# Patient Record
Sex: Female | Born: 1955 | Race: White | Hispanic: No | Marital: Married | Smoking: Never smoker
Health system: Southern US, Community
[De-identification: ages and names within clinical notes are randomized; demographics above are authoritative.]

## PROBLEM LIST (undated history)

## (undated) DIAGNOSIS — K649 Unspecified hemorrhoids: Secondary | ICD-10-CM

## (undated) DIAGNOSIS — M1990 Unspecified osteoarthritis, unspecified site: Secondary | ICD-10-CM

## (undated) DIAGNOSIS — E079 Disorder of thyroid, unspecified: Secondary | ICD-10-CM

## (undated) DIAGNOSIS — N879 Dysplasia of cervix uteri, unspecified: Secondary | ICD-10-CM

## (undated) DIAGNOSIS — I10 Essential (primary) hypertension: Secondary | ICD-10-CM

## (undated) DIAGNOSIS — N393 Stress incontinence (female) (male): Secondary | ICD-10-CM

## (undated) DIAGNOSIS — M722 Plantar fascial fibromatosis: Secondary | ICD-10-CM

## (undated) DIAGNOSIS — K635 Polyp of colon: Secondary | ICD-10-CM

## (undated) HISTORY — DX: Dysplasia of cervix uteri, unspecified: N87.9

## (undated) HISTORY — PX: COLONOSCOPY: SHX174

## (undated) HISTORY — DX: Plantar fascial fibromatosis: M72.2

## (undated) HISTORY — DX: Unspecified osteoarthritis, unspecified site: M19.90

## (undated) HISTORY — DX: Disorder of thyroid, unspecified: E07.9

## (undated) HISTORY — DX: Unspecified hemorrhoids: K64.9

## (undated) HISTORY — DX: Essential (primary) hypertension: I10

## (undated) HISTORY — DX: Stress incontinence (female) (male): N39.3

## (undated) HISTORY — DX: Polyp of colon: K63.5

---
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
## 2001-01-14 HISTORY — PX: HEMORRHOID SURGERY: SHX153

## 2004-08-28 ENCOUNTER — Ambulatory Visit: Payer: Self-pay

## 2005-01-14 HISTORY — PX: TOTAL HIP ARTHROPLASTY: SHX124

## 2005-09-26 ENCOUNTER — Ambulatory Visit: Payer: Self-pay

## 2005-10-03 ENCOUNTER — Ambulatory Visit: Payer: Self-pay

## 2005-12-02 ENCOUNTER — Ambulatory Visit: Payer: Self-pay | Admitting: Gastroenterology

## 2006-03-07 ENCOUNTER — Ambulatory Visit: Payer: Self-pay

## 2006-10-09 ENCOUNTER — Ambulatory Visit: Payer: Self-pay

## 2007-10-12 ENCOUNTER — Ambulatory Visit: Payer: Self-pay

## 2008-03-14 HISTORY — PX: ENDOMETRIAL BIOPSY: SHX622

## 2008-10-17 ENCOUNTER — Ambulatory Visit: Payer: Self-pay

## 2008-11-28 ENCOUNTER — Ambulatory Visit: Payer: Self-pay | Admitting: Gastroenterology

## 2009-11-01 ENCOUNTER — Ambulatory Visit: Payer: Self-pay

## 2010-11-08 ENCOUNTER — Ambulatory Visit: Payer: Self-pay

## 2013-10-11 LAB — HM PAP SMEAR: HM Pap smear: NEGATIVE

## 2015-10-19 ENCOUNTER — Other Ambulatory Visit: Payer: Self-pay | Admitting: Obstetrics and Gynecology

## 2015-10-19 DIAGNOSIS — Z1231 Encounter for screening mammogram for malignant neoplasm of breast: Secondary | ICD-10-CM

## 2015-11-20 ENCOUNTER — Encounter: Payer: Self-pay | Admitting: Radiology

## 2015-11-20 ENCOUNTER — Ambulatory Visit
Admission: RE | Admit: 2015-11-20 | Discharge: 2015-11-20 | Disposition: A | Discharge status: Home / Self Care | Payer: BC Managed Care – PPO | Source: Ambulatory Visit | Attending: Obstetrics and Gynecology | Admitting: Obstetrics and Gynecology

## 2015-11-20 DIAGNOSIS — Z1231 Encounter for screening mammogram for malignant neoplasm of breast: Secondary | ICD-10-CM | POA: Diagnosis present

## 2015-11-24 ENCOUNTER — Other Ambulatory Visit: Payer: Self-pay | Admitting: *Deleted

## 2015-11-24 ENCOUNTER — Inpatient Hospital Stay
Admission: RE | Admit: 2015-11-24 | Discharge: 2015-11-24 | Disposition: A | Discharge status: Home / Self Care | Payer: Self-pay | Source: Ambulatory Visit | Attending: *Deleted | Admitting: *Deleted

## 2015-11-24 DIAGNOSIS — Z9289 Personal history of other medical treatment: Secondary | ICD-10-CM

## 2015-11-27 LAB — HM MAMMOGRAPHY: HM Mammogram: NORMAL (ref 0–4)

## 2016-10-21 ENCOUNTER — Encounter: Payer: Self-pay | Admitting: Obstetrics and Gynecology

## 2016-10-21 ENCOUNTER — Ambulatory Visit (INDEPENDENT_AMBULATORY_CARE_PROVIDER_SITE_OTHER): Payer: BC Managed Care – PPO | Admitting: Obstetrics and Gynecology

## 2016-10-21 VITALS — BP 130/80 | HR 76 | Ht 63.5 in | Wt 180.0 lb

## 2016-10-21 DIAGNOSIS — Z124 Encounter for screening for malignant neoplasm of cervix: Secondary | ICD-10-CM

## 2016-10-21 DIAGNOSIS — Z1231 Encounter for screening mammogram for malignant neoplasm of breast: Secondary | ICD-10-CM

## 2016-10-21 DIAGNOSIS — Z01419 Encounter for gynecological examination (general) (routine) without abnormal findings: Secondary | ICD-10-CM | POA: Diagnosis not present

## 2016-10-21 DIAGNOSIS — Z1151 Encounter for screening for human papillomavirus (HPV): Secondary | ICD-10-CM | POA: Diagnosis not present

## 2016-10-21 DIAGNOSIS — Z1239 Encounter for other screening for malignant neoplasm of breast: Secondary | ICD-10-CM

## 2016-10-21 NOTE — Progress Notes (Signed)
PCP: Patient, No Pcp Per   Chief Complaint  Patient presents with  . Gynecologic Exam    knot on groin area    HPI:      [name] is a 61 y.o. No obstetric history on file. who LMP was No LMP recorded. Patient is postmenopausal., presents today for her annual examination.  Her menses are absent due to menopause. She does not have intermenstrual bleeding. She does not have vasomotor sx.  Sex activity: not sexually active. She does not have vaginal dryness.  Last Pap: October 11, 2013  Results were: no abnormalities /neg HPV DNA.  Hx of STDs: none  Last mammogram: November 20, 2015  Results were: normal--routine follow-up in 12 months There is no FH of breast cancer. There is no FH of ovarian cancer. The patient does do self-breast exams.  Colonoscopy: colonoscopy 3 years ago without abnormalities. . Repeat due after 5 years.  DEXA: 2011; WNL  Tobacco use: The patient denies current or previous tobacco use. Alcohol use: social drinker Exercise: moderately active  She does get adequate calcium and Vitamin D in her diet.  Labs with PCP.   Past Medical History:  Diagnosis Date  . Cervical dysplasia 1970  . Colon polyps   . Hemorrhoids   . Hypertension   . Osteoarthritis   . Plantar fasciitis   . Stress incontinence     Past Surgical History:  Procedure Laterality Date  . COLONOSCOPY  2010, 2015   Polyps  . CRYOTHERAPY  1970  . ENDOMETRIAL BIOPSY  03/2008  . HEMORRHOID SURGERY  2003  . TOTAL HIP ARTHROPLASTY  2007   Left, 2018 Right    Family History  Problem Relation Age of Onset  . Hypertension Mother   . Diabetes type II Mother   . Colon polyps Mother   . Parkinson's disease Father 30  . Parkinson's disease Paternal Aunt 60    Social History   Social History  . Marital status: Married    Spouse name: N/A  . Number of children: N/A  . Years of education: N/A   Occupational History  . Not on file.   Social History Main Topics  .  Smoking status: Never Smoker  . Smokeless tobacco: Never Used  . Alcohol use Yes     Comment: occ  . Drug use: No  . Sexual activity: Not Currently    Partners: Male    Birth control/ protection: None, Post-menopausal   Other Topics Concern  . Not on file   Social History Narrative  . No narrative on file    Current Meds  Medication Sig  . aspirin EC 81 MG tablet Take 1 tab by mouth once daily  . atorvastatin (LIPITOR) 10 MG tablet Take by mouth.  . cetirizine (ZYRTEC) 10 MG tablet Take by mouth.  . Cholecalciferol (VITAMIN D) 2000 units tablet Take by mouth.  . hydrochlorothiazide (HYDRODIURIL) 25 MG tablet Take 1 tab by mouth once daily  . metoprolol succinate (TOPROL-XL) 50 MG 24 hr tablet Take by mouth.  . PSYLLIUM PO 1 tablespoon in water every day.  . tretinoin (RETIN-A) 0.025 % cream       ROS:  Review of Systems  Constitutional: Negative for fatigue, fever and unexpected weight change.  Respiratory: Negative for cough, shortness of breath and wheezing.   Cardiovascular: Negative for chest pain, palpitations and leg swelling.  Gastrointestinal: Negative for blood in stool, constipation, diarrhea, nausea and vomiting.  Endocrine: Negative for  cold intolerance, heat intolerance and polyuria.  Genitourinary: Negative for dyspareunia, dysuria, flank pain, frequency, genital sores, hematuria, menstrual problem, pelvic pain, urgency, vaginal bleeding, vaginal discharge and vaginal pain.  Musculoskeletal: Negative for back pain, joint swelling and myalgias.  Skin: Negative for rash.  Neurological: Negative for dizziness, syncope, light-headedness, numbness and headaches.  Hematological: Negative for adenopathy.  Psychiatric/Behavioral: Negative for agitation, confusion, sleep disturbance and suicidal ideas. The patient is not nervous/anxious.      Objective: BP 130/80 (BP Location: Left Arm, Patient Position: Sitting, Cuff Size: Large)   Pulse 76   Ht 5' 3.5" (1.613  m)   Wt 180 lb (81.6 kg)   BMI 31.39 kg/m    Physical Exam  Constitutional: She is oriented to person, place, and time. She appears well-developed and well-nourished.  Genitourinary: Vagina normal and uterus normal. There is no rash or tenderness on the right labia. There is no rash or tenderness on the left labia. No erythema or tenderness in the vagina. No vaginal discharge found. Right adnexum does not display mass and does not display tenderness. Left adnexum does not display mass and does not display tenderness. Cervix does not exhibit motion tenderness or polyp. Uterus is not enlarged or tender.  Neck: Normal range of motion. No thyromegaly present.  Cardiovascular: Normal rate, regular rhythm and normal heart sounds.   No murmur heard. Pulmonary/Chest: Effort normal and breath sounds normal. Right breast exhibits no mass, no nipple discharge, no skin change and no tenderness. Left breast exhibits no mass, no nipple discharge, no skin change and no tenderness.  Abdominal: Soft. There is no tenderness. There is no guarding.  Musculoskeletal: Normal range of motion.  Neurological: She is alert and oriented to person, place, and time. No cranial nerve deficit.  Psychiatric: She has a normal mood and affect. Her behavior is normal.  Vitals reviewed.   Assessment/Plan:  Encounter for annual routine gynecological examination  Cervical cancer screening - Plan: IGP, Aptima HPV  Screening for HPV (human papillomavirus) - Plan: IGP, Aptima HPV  Screening for breast cancer - Pt to sched mammo - Plan: MM DIGITAL SCREENING BILATERAL          GYN counsel mammography screening, menopause, adequate intake of calcium and vitamin D, diet and exercise    F/U  Return in about 1 year (around 10/21/2017).  Alicia B. Copland, PA-C 10/21/2016 2:09 PM

## 2016-10-23 LAB — IGP, APTIMA HPV
HPV APTIMA: NEGATIVE
PAP SMEAR COMMENT: 0

## 2016-11-27 ENCOUNTER — Encounter: Payer: Self-pay | Admitting: Obstetrics and Gynecology

## 2016-11-27 ENCOUNTER — Ambulatory Visit
Admission: RE | Admit: 2016-11-27 | Discharge: 2016-11-27 | Disposition: A | Discharge status: Home / Self Care | Payer: BC Managed Care – PPO | Source: Ambulatory Visit | Attending: Obstetrics and Gynecology | Admitting: Obstetrics and Gynecology

## 2016-11-27 DIAGNOSIS — Z1231 Encounter for screening mammogram for malignant neoplasm of breast: Secondary | ICD-10-CM | POA: Insufficient documentation

## 2016-11-27 DIAGNOSIS — Z1239 Encounter for other screening for malignant neoplasm of breast: Secondary | ICD-10-CM

## 2017-10-23 ENCOUNTER — Encounter: Payer: Self-pay | Admitting: Obstetrics and Gynecology

## 2017-10-23 ENCOUNTER — Encounter: Payer: BC Managed Care – PPO | Admitting: Obstetrics and Gynecology

## 2017-10-23 VITALS — BP 120/72 | HR 66 | Ht 64.0 in | Wt 173.0 lb

## 2017-10-23 DIAGNOSIS — Z01419 Encounter for gynecological examination (general) (routine) without abnormal findings: Secondary | ICD-10-CM

## 2017-10-23 DIAGNOSIS — Z1239 Encounter for other screening for malignant neoplasm of breast: Secondary | ICD-10-CM

## 2017-10-23 NOTE — Progress Notes (Signed)
PCP: Patient, No Pcp Per   Chief Complaint  Patient presents with  . Gynecologic Exam  . Error    HPI:      [name] is a 62 y.o. No obstetric history on file. who LMP was No LMP recorded. Patient is postmenopausal., presents today for her annual examination.  Her menses are absent due to menopause. She does not have intermenstrual bleeding. She does not have vasomotor sx.  Sex activity: not sexually active. She does not have vaginal dryness.  Last Pap: 10/21/16  Results were: no abnormalities /neg HPV DNA.  Hx of STDs: none  Last mammogram: 11/27/16 Results were: normal--routine follow-up in 12 months There is no FH of breast cancer. There is no FH of ovarian cancer. The patient does do self-breast exams.  Colonoscopy: colonoscopy 4 years ago without abnormalities. Repeat due after 5 years.  DEXA: 2011; WNL  Tobacco use: The patient denies current or previous tobacco use. Alcohol use: social drinker Exercise: moderately active  She does get adequate calcium and Vitamin D in her diet.  Labs with PCP.   Past Medical History:  Diagnosis Date  . Cervical dysplasia 1970  . Colon polyps   . Hemorrhoids   . Hypertension   . Osteoarthritis   . Plantar fasciitis   . Stress incontinence     Past Surgical History:  Procedure Laterality Date  . COLONOSCOPY  2010, 2015   Polyps  . CRYOTHERAPY  1970  . ENDOMETRIAL BIOPSY  03/2008  . HEMORRHOID SURGERY  2003  . TOTAL HIP ARTHROPLASTY  2007   Left, 2018 Right    Family History  Problem Relation Age of Onset  . Hypertension Mother   . Diabetes type II Mother   . Colon polyps Mother   . Parkinson's disease Father 35  . Parkinson's disease Paternal Aunt 71  . Breast cancer Neg Hx     Social History   Socioeconomic History  . Marital status: Married    Spouse name: Not on file  . Number of children: Not on file  . Years of education: Not on file  . Highest education level: Not on file    Occupational History  . Not on file  Social Needs  . Financial resource strain: Not on file  . Food insecurity:    Worry: Not on file    Inability: Not on file  . Transportation needs:    Medical: Not on file    Non-medical: Not on file  Tobacco Use  . Smoking status: Never Smoker  . Smokeless tobacco: Never Used  Substance and Sexual Activity  . Alcohol use: Yes    Comment: occ  . Drug use: No  . Sexual activity: Not Currently    Partners: Male    Birth control/protection: Post-menopausal  Lifestyle  . Physical activity:    Days per week: Not on file    Minutes per session: Not on file  . Stress: Not on file  Relationships  . Social connections:    Talks on phone: Not on file    Gets together: Not on file    Attends religious service: Not on file    Active member of club or organization: Not on file    Attends meetings of clubs or organizations: Not on file    Relationship status: Not on file  . Intimate partner violence:    Fear of current or ex partner: Not on file    Emotionally abused: Not  on file    Physically abused: Not on file    Forced sexual activity: Not on file  Other Topics Concern  . Not on file  Social History Narrative  . Not on file    Current Meds  Medication Sig  . aspirin EC 81 MG tablet Take 1 tab by mouth once daily  . atorvastatin (LIPITOR) 10 MG tablet atorvastatin 10 mg tablet  TAKE 1 TABLET (10 MG TOTAL) BY MOUTH ONCE DAILY.  . cetirizine (ZYRTEC) 10 MG tablet Take by mouth.  . Cholecalciferol (VITAMIN D) 2000 units tablet Take by mouth.  . fluticasone (FLONASE) 50 MCG/ACT nasal spray Place into both nostrils 2 (two) times daily.  . hydrochlorothiazide (HYDRODIURIL) 25 MG tablet Take 1 tab by mouth once daily  . Influenza Vac Subunit Quad (FLUCELVAX QUADRIVALENT) 0.5 ML SUSY Flucelvax Quad 2018-2019 (PF) 60 mcg (15 mcg x 4)/0.5 mL IM syringe  TO BE ADMINISTERED BY PHARMACIST FOR IMMUNIZATION  . Olmesartan-amLODIPine-HCTZ 20-5-12.5 MG  TABS   . PSYLLIUM PO 1 tablespoon in water every day.  . tretinoin (RETIN-A) 0.025 % cream     ROS:  Review of Systems  Constitutional: Negative for fatigue, fever and unexpected weight change.  Respiratory: Negative for cough, shortness of breath and wheezing.   Cardiovascular: Negative for chest pain, palpitations and leg swelling.  Gastrointestinal: Negative for blood in stool, constipation, diarrhea, nausea and vomiting.  Endocrine: Negative for cold intolerance, heat intolerance and polyuria.  Genitourinary: Negative for dyspareunia, dysuria, flank pain, frequency, genital sores, hematuria, menstrual problem, pelvic pain, urgency, vaginal bleeding, vaginal discharge and vaginal pain.  Musculoskeletal: Negative for back pain, joint swelling and myalgias.  Skin: Negative for rash.  Neurological: Negative for dizziness, syncope, light-headedness, numbness and headaches.  Hematological: Negative for adenopathy.  Psychiatric/Behavioral: Negative for agitation, confusion, sleep disturbance and suicidal ideas. The patient is not nervous/anxious.     Objective: BP 120/72   Pulse 66   Ht 5\' 4"  (1.626 m)   Wt 173 lb (78.5 kg)   BMI 29.70 kg/m    Physical Exam  Constitutional: She is oriented to person, place, and time. She appears well-developed and well-nourished.  Genitourinary: Vagina normal and uterus normal. There is no rash or tenderness on the right labia. There is no rash or tenderness on the left labia. No erythema or tenderness in the vagina. No vaginal discharge found. Right adnexum does not display mass and does not display tenderness. Left adnexum does not display mass and does not display tenderness. Cervix does not exhibit motion tenderness or polyp. Uterus is not enlarged or tender.  Neck: Normal range of motion. No thyromegaly present.  Cardiovascular: Normal rate, regular rhythm and normal heart sounds.  No murmur heard. Pulmonary/Chest: Effort normal and breath  sounds normal. Right breast exhibits no mass, no nipple discharge, no skin change and no tenderness. Left breast exhibits no mass, no nipple discharge, no skin change and no tenderness.  Abdominal: Soft. There is no tenderness. There is no guarding.  Musculoskeletal: Normal range of motion.  Neurological: She is alert and oriented to person, place, and time. No cranial nerve deficit.  Psychiatric: She has a normal mood and affect. Her behavior is normal.  Vitals reviewed.   Assessment/Plan:  Encounter for annual routine gynecological examination  Screening for breast cancer - Plan: MM 3D SCREEN BREAST BILATERAL  Erroneous encounter - disregard          GYN counsel mammography screening, menopause, adequate intake of calcium and vitamin  D, diet and exercise    F/U  Return in about 1 year (around 10/24/2018).  Khari Mally B. Jostin Rue, PA-C 12/05/2017 3:08 PM

## 2017-10-23 NOTE — Patient Instructions (Signed)
I value your feedback and entrusting us with your care. If you get a [facility] patient survey, I would appreciate you taking the time to let us know about your experience today. Thank you! 

## 2017-10-29 NOTE — Progress Notes (Signed)
This encounter was created in error - please disregard.

## 2017-11-28 ENCOUNTER — Ambulatory Visit
Admission: RE | Admit: 2017-11-28 | Discharge: 2017-11-28 | Disposition: A | Discharge status: Home / Self Care | Payer: BC Managed Care – PPO | Source: Ambulatory Visit | Attending: Obstetrics and Gynecology | Admitting: Obstetrics and Gynecology

## 2017-11-28 DIAGNOSIS — Z1239 Encounter for other screening for malignant neoplasm of breast: Secondary | ICD-10-CM | POA: Diagnosis not present

## 2017-11-30 ENCOUNTER — Encounter: Payer: Self-pay | Admitting: Obstetrics and Gynecology

## 2018-10-15 ENCOUNTER — Other Ambulatory Visit: Payer: Self-pay | Admitting: Family Medicine

## 2018-10-15 DIAGNOSIS — Z1231 Encounter for screening mammogram for malignant neoplasm of breast: Secondary | ICD-10-CM

## 2018-10-22 NOTE — Progress Notes (Signed)
PCP: Patient, No Pcp Per   Chief Complaint  Patient presents with  . Gynecologic Exam    HPI:      [name] is a 63 y.o. No obstetric history on file. who LMP was No LMP recorded. Patient is postmenopausal., presents today for her annual examination.  Her menses are absent due to menopause. She does not have intermenstrual bleeding. She does not have vasomotor sx.  Sex activity: not sexually active. She does not have vaginal dryness.  Last Pap: 10/21/16  Results were: no abnormalities /neg HPV DNA.  Hx of STDs: none  Last mammogram: 11/18/17 Results were: normal--routine follow-up in 12 months There is no FH of breast cancer. There is no FH of ovarian cancer. The patient does do self-breast exams.  Colonoscopy: colonoscopy 9/20 without abnormalities.  Repeat due after 10 years. Having issues with hemorrhoids since procedure with rectal bleeding with BMs. Having increased constipation. Hasn't tried OTC meds.   DEXA: 2011; WNL; due for repeat  Tobacco use: The patient denies current or previous tobacco use. Alcohol use: social drinker  No drug use. Exercise: moderately active  She does get adequate calcium and Vitamin D in her diet.  Labs with PCP.   Past Medical History:  Diagnosis Date  . Cervical dysplasia 1970  . Colon polyps   . Hemorrhoids   . Hypertension   . Osteoarthritis   . Plantar fasciitis   . Stress incontinence   . Thyroid disease    Hypothyroidism    Past Surgical History:  Procedure Laterality Date  . COLONOSCOPY  2010, 2015, 2020   Polyps; 9/20 repeat in 10 yrs  . CRYOTHERAPY  1970  . ENDOMETRIAL BIOPSY  03/2008  . HEMORRHOID SURGERY  2003  . TOTAL HIP ARTHROPLASTY  2007   Left, 2018 Right    Family History  Problem Relation Age of Onset  . Hypertension Mother   . Diabetes type II Mother   . Colon polyps Mother   . CVA Mother 100  . Parkinson's disease Father 34  . Parkinson's disease Paternal Aunt 32  . Breast cancer Neg Hx      Social History   Socioeconomic History  . Marital status: Married    Spouse name: Not on file  . Number of children: Not on file  . Years of education: Not on file  . Highest education level: Not on file  Occupational History  . Not on file  Social Needs  . Financial resource strain: Not on file  . Food insecurity    Worry: Not on file    Inability: Not on file  . Transportation needs    Medical: Not on file    Non-medical: Not on file  Tobacco Use  . Smoking status: Never Smoker  . Smokeless tobacco: Never Used  Substance and Sexual Activity  . Alcohol use: Yes    Comment: occ  . Drug use: No  . Sexual activity: Not Currently    Partners: Male    Birth control/protection: Post-menopausal  Lifestyle  . Physical activity    Days per week: Not on file    Minutes per session: Not on file  . Stress: Not on file  Relationships  . Social Musician on phone: Not on file    Gets together: Not on file    Attends religious service: Not on file    Active member of club or organization: Not on file    Attends meetings  of clubs or organizations: Not on file    Relationship status: Not on file  . Intimate partner violence    Fear of current or ex partner: Not on file    Emotionally abused: Not on file    Physically abused: Not on file    Forced sexual activity: Not on file  Other Topics Concern  . Not on file  Social History Narrative  . Not on file    Current Meds  Medication Sig  . aspirin EC 81 MG tablet Take 1 tab by mouth once daily  . atorvastatin (LIPITOR) 10 MG tablet atorvastatin 10 mg tablet  TAKE 1 TABLET (10 MG TOTAL) BY MOUTH ONCE DAILY.  . cetirizine (ZYRTEC) 10 MG tablet Take by mouth.  . Cholecalciferol (VITAMIN D) 2000 units tablet Take by mouth.  . fluticasone (FLONASE) 50 MCG/ACT nasal spray Place into both nostrils 2 (two) times daily.  . hydrochlorothiazide (HYDRODIURIL) 25 MG tablet Take 1 tab by mouth once daily  . KRILL OIL PO  Take by mouth.  . levothyroxine (SYNTHROID) 50 MCG tablet TAKE 1 TABLET DAILY ON EMPTY STOMACH WITH A GLASS OF WATER AT LEAST 30-60 MINUTES BEFORE BREAKFAST.  Marland Kitchen olmesartan (BENICAR) 20 MG tablet Take by mouth.  . PSYLLIUM PO 1 tablespoon in water every day.  . tretinoin (RETIN-A) 0.025 % cream       ROS:  Review of Systems  Constitutional: Negative for fatigue, fever and unexpected weight change.  Respiratory: Negative for cough, shortness of breath and wheezing.   Cardiovascular: Negative for chest pain, palpitations and leg swelling.  Gastrointestinal: Negative for blood in stool, constipation, diarrhea, nausea and vomiting.  Endocrine: Negative for cold intolerance, heat intolerance and polyuria.  Genitourinary: Negative for dyspareunia, dysuria, flank pain, frequency, genital sores, hematuria, menstrual problem, pelvic pain, urgency, vaginal bleeding, vaginal discharge and vaginal pain.  Musculoskeletal: Negative for back pain, joint swelling and myalgias.  Skin: Negative for rash.  Neurological: Negative for dizziness, syncope, light-headedness, numbness and headaches.  Hematological: Negative for adenopathy.  Psychiatric/Behavioral: Negative for agitation, confusion, sleep disturbance and suicidal ideas. The patient is not nervous/anxious.      Objective: BP 108/80   Ht 5' 3.5" (1.613 m)   Wt 174 lb (78.9 kg)   BMI 30.34 kg/m    Physical Exam Constitutional:      Appearance: She is well-developed.  Genitourinary:     Vulva, vagina, uterus, right adnexa and left adnexa normal.     No vulval lesion or tenderness noted.     No vaginal discharge, erythema or tenderness.     No cervical motion tenderness or polyp.     Uterus is not enlarged or tender.     No right or left adnexal mass present.     Right adnexa not tender.     Left adnexa not tender.  Neck:     Musculoskeletal: Normal range of motion.     Thyroid: No thyromegaly.  Cardiovascular:     Rate and Rhythm:  Normal rate and regular rhythm.     Heart sounds: Normal heart sounds. No murmur.  Pulmonary:     Effort: Pulmonary effort is normal.     Breath sounds: Normal breath sounds.  Chest:     Breasts:        Right: No mass, nipple discharge, skin change or tenderness.        Left: No mass, nipple discharge, skin change or tenderness.  Abdominal:     Palpations: Abdomen  is soft.     Tenderness: There is no abdominal tenderness. There is no guarding.  Musculoskeletal: Normal range of motion.  Neurological:     General: No focal deficit present.     Mental Status: She is alert and oriented to person, place, and time.     Cranial Nerves: No cranial nerve deficit.  Skin:    General: Skin is warm and dry.  Psychiatric:        Mood and Affect: Mood normal.        Behavior: Behavior normal.        Thought Content: Thought content normal.        Judgment: Judgment normal.  Vitals signs reviewed.     Assessment/Plan:  Encounter for annual routine gynecological examination  Encounter for screening mammogram for malignant neoplasm of breast - Plan: pt has mammo sched  Screening for osteoporosis - Plan: DG Bone Density,   Hemorrhoid--try OTC products/increase fiber for constipation. Can do Rx if sx persist.           GYN counsel mammography screening, menopause, adequate intake of calcium and vitamin D, diet and exercise    F/U  Return in about 2 years (around 10/25/2020) for annual.  Brandi Armato B. Shanterria Franta, PA-C 10/26/2018 11:29 AM

## 2018-10-26 ENCOUNTER — Ambulatory Visit (INDEPENDENT_AMBULATORY_CARE_PROVIDER_SITE_OTHER): Payer: Medicare Other | Admitting: Obstetrics and Gynecology

## 2018-10-26 ENCOUNTER — Other Ambulatory Visit: Payer: Self-pay

## 2018-10-26 ENCOUNTER — Encounter: Payer: Self-pay | Admitting: Obstetrics and Gynecology

## 2018-10-26 VITALS — BP 108/80 | Ht 63.5 in | Wt 174.0 lb

## 2018-10-26 DIAGNOSIS — Z01419 Encounter for gynecological examination (general) (routine) without abnormal findings: Secondary | ICD-10-CM | POA: Diagnosis not present

## 2018-10-26 DIAGNOSIS — Z13820 Encounter for screening for osteoporosis: Secondary | ICD-10-CM

## 2018-10-26 DIAGNOSIS — Z1231 Encounter for screening mammogram for malignant neoplasm of breast: Secondary | ICD-10-CM

## 2018-10-26 DIAGNOSIS — K640 First degree hemorrhoids: Secondary | ICD-10-CM

## 2018-10-26 NOTE — Patient Instructions (Signed)
I value your feedback and entrusting us with your care. If you get a [facility] patient survey, I would appreciate you taking the time to let us know about your experience today. Thank you!  [facility]: [phone]    

## 2018-12-02 ENCOUNTER — Ambulatory Visit
Admission: RE | Admit: 2018-12-02 | Discharge: 2018-12-02 | Disposition: A | Discharge status: Home / Self Care | Payer: Medicare Other | Source: Ambulatory Visit | Attending: Family Medicine | Admitting: Family Medicine

## 2018-12-02 DIAGNOSIS — Z1231 Encounter for screening mammogram for malignant neoplasm of breast: Secondary | ICD-10-CM | POA: Diagnosis not present

## 2019-09-08 ENCOUNTER — Telehealth: Payer: Self-pay | Admitting: Obstetrics and Gynecology

## 2019-09-08 NOTE — Telephone Encounter (Signed)
Patient is calling to schedule yearly follow up for October. patient is on medicare and wants know how she needs to go about scheduling her yearly mammogram. Please advise

## 2019-09-08 NOTE — Telephone Encounter (Signed)
Clarified with [facility] Dec.

## 2019-10-27 NOTE — Progress Notes (Signed)
PCP: Patient, No Pcp Per   Chief Complaint  Patient presents with  . Gynecologic Exam    HPI:      Kirsten Jackson is a 64 y.o. No obstetric history on file. who LMP was No LMP recorded. Patient is postmenopausal., presents today for her annual examination.  Her menses are absent due to menopause. She does not have intermenstrual bleeding. She has had occas vasomotor sx recently but unusual for pt. Could be stress related.  Sex activity: not sexually active. She does not have vaginal dryness.  Last Pap: 10/21/16  Results were: no abnormalities /neg HPV DNA.  Hx of STDs: none  Last mammogram: 12/02/18 Results were: normal--routine follow-up in 12 months There is no FH of breast cancer. There is no FH of ovarian cancer. The patient does self-breast exams.  Colonoscopy: colonoscopy 9/20 without abnormalities.  Repeat due after 10 years. No issues with hemorrhoids recently.  DEXA: 2011; WNL; due for repeat--not done last yr; pt declines.  Tobacco use: The patient denies current or previous tobacco use. Alcohol use: social drinker  No drug use. Exercise: moderately active  She does get adequate calcium and Vitamin D in her diet.  Labs with PCP.   Past Medical History:  Diagnosis Date  . Cervical dysplasia 1970  . Colon polyps   . Hemorrhoids   . Hypertension   . Osteoarthritis   . Plantar fasciitis   . Stress incontinence   . Thyroid disease    Hypothyroidism    Past Surgical History:  Procedure Laterality Date  . COLONOSCOPY  2010, 2015, 2020   Polyps; 9/20 repeat in 10 yrs  . CRYOTHERAPY  1970  . ENDOMETRIAL BIOPSY  03/2008  . HEMORRHOID SURGERY  2003  . TOTAL HIP ARTHROPLASTY  2007   Left, 2018 Right    Family History  Problem Relation Age of Onset  . Hypertension Mother   . Diabetes type II Mother   . Colon polyps Mother   . CVA Mother 75  . Parkinson's disease Father 62  . Parkinson's disease Paternal Aunt 47  . Breast cancer Neg Hx      Social History   Socioeconomic History  . Marital status: Married    Spouse name: Not on file  . Number of children: Not on file  . Years of education: Not on file  . Highest education level: Not on file  Occupational History  . Not on file  Tobacco Use  . Smoking status: Never Smoker  . Smokeless tobacco: Never Used  Vaping Use  . Vaping Use: Never used  Substance and Sexual Activity  . Alcohol use: Yes    Comment: occ  . Drug use: No  . Sexual activity: Not Currently    Partners: Male    Birth control/protection: Post-menopausal  Other Topics Concern  . Not on file  Social History Narrative  . Not on file   Social Determinants of Health   Financial Resource Strain:   . Difficulty of Paying Living Expenses: Not on file  Food Insecurity:   . Worried About Programme researcher, broadcasting/film/video in the Last Year: Not on file  . Ran Out of Food in the Last Year: Not on file  Transportation Needs:   . Lack of Transportation (Medical): Not on file  . Lack of Transportation (Non-Medical): Not on file  Physical Activity:   . Days of Exercise per Week: Not on file  . Minutes of Exercise per Session: Not on file  Stress:   . Feeling of Stress : Not on file  Social Connections:   . Frequency of Communication with Friends and Family: Not on file  . Frequency of Social Gatherings with Friends and Family: Not on file  . Attends Religious Services: Not on file  . Active Member of Clubs or Organizations: Not on file  . Attends Banker Meetings: Not on file  . Marital Status: Not on file  Intimate Partner Violence:   . Fear of Current or Ex-Partner: Not on file  . Emotionally Abused: Not on file  . Physically Abused: Not on file  . Sexually Abused: Not on file    Current Meds  Medication Sig  . atorvastatin (LIPITOR) 10 MG tablet atorvastatin 10 mg tablet  TAKE 1 TABLET (10 MG TOTAL) BY MOUTH ONCE DAILY.  . cetirizine (ZYRTEC) 10 MG tablet Take by mouth.  .  Cholecalciferol (VITAMIN D) 2000 units tablet Take by mouth.  . fluticasone (FLONASE) 50 MCG/ACT nasal spray Place into both nostrils 2 (two) times daily.  . hydrochlorothiazide (HYDRODIURIL) 25 MG tablet Take 1 tab by mouth once daily  . KRILL OIL PO Take by mouth.  . levothyroxine (SYNTHROID) 50 MCG tablet TAKE 1 TABLET DAILY ON EMPTY STOMACH WITH A GLASS OF WATER AT LEAST 30-60 MINUTES BEFORE BREAKFAST.  Marland Kitchen olmesartan (BENICAR) 20 MG tablet Take by mouth.  . PSYLLIUM PO 1 tablespoon in water every day.  . tretinoin (RETIN-A) 0.025 % cream       ROS:  Review of Systems  Constitutional: Negative for fatigue, fever and unexpected weight change.  Respiratory: Negative for cough, shortness of breath and wheezing.   Cardiovascular: Negative for chest pain, palpitations and leg swelling.  Gastrointestinal: Negative for blood in stool, constipation, diarrhea, nausea and vomiting.  Endocrine: Negative for cold intolerance, heat intolerance and polyuria.  Genitourinary: Negative for dyspareunia, dysuria, flank pain, frequency, genital sores, hematuria, menstrual problem, pelvic pain, urgency, vaginal bleeding, vaginal discharge and vaginal pain.  Musculoskeletal: Negative for back pain, joint swelling and myalgias.  Skin: Negative for rash.  Neurological: Negative for dizziness, syncope, light-headedness, numbness and headaches.  Hematological: Negative for adenopathy.  Psychiatric/Behavioral: Negative for agitation, confusion, sleep disturbance and suicidal ideas. The patient is not nervous/anxious.      Objective: BP 120/80   Ht 5' 3.5" (1.613 m)   Wt 173 lb (78.5 kg)   BMI 30.16 kg/m    Physical Exam Constitutional:      Appearance: She is well-developed.  Genitourinary:     Vulva, vagina, uterus, right adnexa and left adnexa normal.     No vulval lesion or tenderness noted.     No vaginal discharge, erythema or tenderness.     No cervical motion tenderness or polyp.      Uterus is not enlarged or tender.     No right or left adnexal mass present.     Right adnexa not tender.     Left adnexa not tender.  Neck:     Thyroid: No thyromegaly.  Cardiovascular:     Rate and Rhythm: Normal rate and regular rhythm.     Heart sounds: Normal heart sounds. No murmur heard.   Pulmonary:     Effort: Pulmonary effort is normal.     Breath sounds: Normal breath sounds.  Chest:     Breasts:        Right: No mass, nipple discharge, skin change or tenderness.        Left:  No mass, nipple discharge, skin change or tenderness.  Abdominal:     Palpations: Abdomen is soft.     Tenderness: There is no abdominal tenderness. There is no guarding.  Musculoskeletal:        General: Normal range of motion.     Cervical back: Normal range of motion.  Neurological:     General: No focal deficit present.     Mental Status: She is alert and oriented to person, place, and time.     Cranial Nerves: No cranial nerve deficit.  Skin:    General: Skin is warm and dry.  Psychiatric:        Mood and Affect: Mood normal.        Behavior: Behavior normal.        Thought Content: Thought content normal.        Judgment: Judgment normal.  Vitals reviewed.     Assessment/Plan:  Encounter for annual routine gynecological examination  Cervical cancer screening - Plan: Cytology - PAP  Encounter for screening mammogram for malignant neoplasm of breast - Plan: MM 3D SCREEN BREAST BILATERAL; pt to sched mammo  Screening for osteoporosis--declines DEXA for now. F/u prn. Cont ca/Vit D/exercise          GYN counsel mammography screening, menopause, adequate intake of calcium and vitamin D, diet and exercise    F/U  Return in about 2 years (around 10/27/2021).  Christoph Copelan B. Javelle Donigan, PA-C 10/28/2019 11:12 AM

## 2019-10-28 ENCOUNTER — Other Ambulatory Visit: Payer: Self-pay

## 2019-10-28 ENCOUNTER — Other Ambulatory Visit (HOSPITAL_COMMUNITY)
Admission: RE | Admit: 2019-10-28 | Discharge: 2019-10-28 | Disposition: A | Discharge status: Home / Self Care | Payer: Medicare PPO | Source: Ambulatory Visit | Attending: Obstetrics and Gynecology | Admitting: Obstetrics and Gynecology

## 2019-10-28 ENCOUNTER — Ambulatory Visit (INDEPENDENT_AMBULATORY_CARE_PROVIDER_SITE_OTHER): Payer: Medicare PPO | Admitting: Obstetrics and Gynecology

## 2019-10-28 ENCOUNTER — Encounter: Payer: Self-pay | Admitting: Obstetrics and Gynecology

## 2019-10-28 VITALS — BP 120/80 | Ht 63.5 in | Wt 173.0 lb

## 2019-10-28 DIAGNOSIS — Z124 Encounter for screening for malignant neoplasm of cervix: Secondary | ICD-10-CM | POA: Insufficient documentation

## 2019-10-28 DIAGNOSIS — Z13820 Encounter for screening for osteoporosis: Secondary | ICD-10-CM | POA: Diagnosis not present

## 2019-10-28 DIAGNOSIS — Z01419 Encounter for gynecological examination (general) (routine) without abnormal findings: Secondary | ICD-10-CM | POA: Diagnosis not present

## 2019-10-28 DIAGNOSIS — Z1231 Encounter for screening mammogram for malignant neoplasm of breast: Secondary | ICD-10-CM | POA: Diagnosis not present

## 2019-10-28 NOTE — Patient Instructions (Addendum)
I value your feedback and entrusting us with your care. If you get a [facility] patient survey, I would appreciate you taking the time to let us know about your experience today. Thank you! ° °As of December 24, 2018, your lab results will be released to your MyChart immediately, before I even have a chance to see them. Please give me time to review them and contact you if there are any abnormalities. Thank you for your patience.  ° °[facility]: [phone] ° ° ° °

## 2019-10-29 LAB — CYTOLOGY - PAP: Diagnosis: NEGATIVE

## 2019-11-22 ENCOUNTER — Ambulatory Visit: Admit: 2019-11-22 | Payer: Medicare Other | Admitting: Obstetrics and Gynecology

## 2019-11-22 SURGERY — HYSTERECTOMY, VAGINAL, LAPAROSCOPY-ASSISTED, WITH SALPINGECTOMY
Anesthesia: General

## 2019-12-06 ENCOUNTER — Ambulatory Visit
Admission: RE | Admit: 2019-12-06 | Discharge: 2019-12-06 | Disposition: A | Discharge status: Home / Self Care | Payer: Medicare PPO | Source: Ambulatory Visit | Attending: Obstetrics and Gynecology | Admitting: Obstetrics and Gynecology

## 2019-12-06 ENCOUNTER — Other Ambulatory Visit: Payer: Self-pay

## 2019-12-06 DIAGNOSIS — Z1231 Encounter for screening mammogram for malignant neoplasm of breast: Secondary | ICD-10-CM

## 2020-11-01 ENCOUNTER — Ambulatory Visit (INDEPENDENT_AMBULATORY_CARE_PROVIDER_SITE_OTHER): Payer: Medicare PPO | Admitting: Obstetrics and Gynecology

## 2020-11-01 ENCOUNTER — Other Ambulatory Visit: Payer: Self-pay

## 2020-11-01 ENCOUNTER — Encounter: Payer: Self-pay | Admitting: Obstetrics and Gynecology

## 2020-11-01 VITALS — BP 122/80 | Ht 63.5 in | Wt 171.0 lb

## 2020-11-01 DIAGNOSIS — Z01419 Encounter for gynecological examination (general) (routine) without abnormal findings: Secondary | ICD-10-CM | POA: Diagnosis not present

## 2020-11-01 DIAGNOSIS — Z13820 Encounter for screening for osteoporosis: Secondary | ICD-10-CM

## 2020-11-01 DIAGNOSIS — Z1231 Encounter for screening mammogram for malignant neoplasm of breast: Secondary | ICD-10-CM | POA: Diagnosis not present

## 2020-11-01 NOTE — Patient Instructions (Signed)
I value your feedback and you entrusting us with your care. If you get a [facility] patient survey, I would appreciate you taking the time to let us know about your experience today. Thank you!  [facility]: [phone]      

## 2020-11-01 NOTE — Progress Notes (Signed)
PCP: Lynwood Dawley, MD   Chief Complaint  Patient presents with   Gynecologic Exam    No concerns    HPI:      [name] is a 65 y.o. No obstetric history on file. who LMP was No LMP recorded. Patient is postmenopausal., presents today for her annual examination.  Her menses are absent due to menopause. She does not have PMB. She denies vasomotor sx .  Sex activity: not sexually active. She does not have vaginal dryness/vag sx.  Last Pap: 10/18/19 Results were: no abnormalities /neg HPV DNA 2018.  Hx of STDs: none  Last mammogram: 12/06/19 Results were: normal--routine follow-up in 12 months There is no FH of breast cancer. There is no FH of ovarian cancer. The patient does self-breast exams.  Colonoscopy: colonoscopy 9/20 without abnormalities.  Repeat due after 10 years. No issues with hemorrhoids recently.  DEXA: 2011 at [facility]; WNL; due for repeat  Tobacco use: The patient denies current or previous tobacco use. Alcohol use: social drinker  No drug use. Exercise: moderately active  She does get adequate calcium and Vitamin D in her diet.  Labs with PCP.   Past Medical History:  Diagnosis Date   Cervical dysplasia 1970   Colon polyps    Hemorrhoids    Hypertension    Osteoarthritis    Plantar fasciitis    Stress incontinence    Thyroid disease    Hypothyroidism    Past Surgical History:  Procedure Laterality Date   COLONOSCOPY  2010, 2015, 2020   Polyps; 9/20 repeat in 10 yrs   CRYOTHERAPY  1970   ENDOMETRIAL BIOPSY  03/2008   HEMORRHOID SURGERY  2003   TOTAL HIP ARTHROPLASTY  2007   Left, 2018 Right    Family History  Problem Relation Age of Onset   Hypertension Mother    Diabetes type II Mother    Colon polyps Mother    CVA Mother 11   Parkinson's disease Father 19   Parkinson's disease Paternal Aunt 59   Breast cancer Neg Hx     Social History   Socioeconomic History   Marital status: Married    Spouse name: Not on file    Number of children: Not on file   Years of education: Not on file   Highest education level: Not on file  Occupational History   Not on file  Tobacco Use   Smoking status: Never   Smokeless tobacco: Never  Vaping Use   Vaping Use: Never used  Substance and Sexual Activity   Alcohol use: Yes    Comment: occ   Drug use: No   Sexual activity: Not Currently    Partners: Male    Birth control/protection: Post-menopausal  Other Topics Concern   Not on file  Social History Narrative   Not on file   Social Determinants of Health   Financial Resource Strain: Not on file  Food Insecurity: Not on file  Transportation Needs: Not on file  Physical Activity: Not on file  Stress: Not on file  Social Connections: Not on file  Intimate Partner Violence: Not on file    Current Meds  Medication Sig   aspirin EC 81 MG tablet Take 1 tab by mouth once daily   atorvastatin (LIPITOR) 10 MG tablet atorvastatin 10 mg tablet  TAKE 1 TABLET (10 MG TOTAL) BY MOUTH ONCE DAILY.   cetirizine (ZYRTEC) 10 MG tablet Take by mouth.   Cholecalciferol (VITAMIN D) 2000  units tablet Take by mouth.   fluticasone (FLONASE) 50 MCG/ACT nasal spray Place into both nostrils 2 (two) times daily.   hydrochlorothiazide (HYDRODIURIL) 25 MG tablet Take 1 tab by mouth once daily   KRILL OIL PO Take by mouth.   levothyroxine (SYNTHROID) 50 MCG tablet TAKE 1 TABLET DAILY ON EMPTY STOMACH WITH A GLASS OF WATER AT LEAST 30-60 MINUTES BEFORE BREAKFAST.   olmesartan (BENICAR) 20 MG tablet Take by mouth.   PSYLLIUM PO 1 tablespoon in water every day.   tretinoin (RETIN-A) 0.025 % cream       ROS:  Review of Systems  Constitutional:  Negative for fatigue, fever and unexpected weight change.  Respiratory:  Negative for cough, shortness of breath and wheezing.   Cardiovascular:  Negative for chest pain, palpitations and leg swelling.  Gastrointestinal:  Negative for blood in stool, constipation, diarrhea, nausea and  vomiting.  Endocrine: Negative for cold intolerance, heat intolerance and polyuria.  Genitourinary:  Negative for dyspareunia, dysuria, flank pain, frequency, genital sores, hematuria, menstrual problem, pelvic pain, urgency, vaginal bleeding, vaginal discharge and vaginal pain.  Musculoskeletal:  Negative for back pain, joint swelling and myalgias.  Skin:  Negative for rash.  Neurological:  Negative for dizziness, syncope, light-headedness, numbness and headaches.  Hematological:  Negative for adenopathy.  Psychiatric/Behavioral:  Negative for agitation, confusion, sleep disturbance and suicidal ideas. The patient is not nervous/anxious.     Objective: BP 122/80   Ht 5' 3.5" (1.613 m)   Wt 171 lb (77.6 kg)   BMI 29.82 kg/m    Physical Exam Constitutional:      Appearance: She is well-developed.  Genitourinary:     Vulva normal.     No vaginal discharge, erythema or tenderness.      Right Adnexa: not tender and no mass present.    Left Adnexa: not tender and no mass present.    No cervical motion tenderness or polyp.     Uterus is not enlarged or tender.  Breasts:    Right: No mass, nipple discharge, skin change or tenderness.     Left: No mass, nipple discharge, skin change or tenderness.  Neck:     Thyroid: No thyromegaly.  Cardiovascular:     Rate and Rhythm: Normal rate and regular rhythm.     Heart sounds: Normal heart sounds. No murmur heard. Pulmonary:     Effort: Pulmonary effort is normal.     Breath sounds: Normal breath sounds.  Abdominal:     Palpations: Abdomen is soft.     Tenderness: There is no abdominal tenderness. There is no guarding.  Musculoskeletal:        General: Normal range of motion.     Cervical back: Normal range of motion.  Neurological:     General: No focal deficit present.     Mental Status: She is alert and oriented to person, place, and time.     Cranial Nerves: No cranial nerve deficit.  Skin:    General: Skin is warm and dry.   Psychiatric:        Mood and Affect: Mood normal.        Behavior: Behavior normal.        Thought Content: Thought content normal.        Judgment: Judgment normal.  Vitals reviewed.    Assessment/Plan:  Encounter for annual routine gynecological examination  Encounter for screening mammogram for malignant neoplasm of breast - Plan: MM 3D SCREEN BREAST BILATERAL; pt to sched  mammo  Screening for osteoporosis - Plan: DG Bone Density; pt to sched DEXA. Cont ca/Vit D/exercise.           GYN counsel mammography screening, menopause, adequate intake of calcium and vitamin D, diet and exercise    F/U  Return in about 2 years (around 11/02/2022).  Camran Keady B. Anshika Pethtel, PA-C 11/01/2020 5:01 PM

## 2020-12-12 ENCOUNTER — Ambulatory Visit
Admission: RE | Admit: 2020-12-12 | Discharge: 2020-12-12 | Disposition: A | Discharge status: Home / Self Care | Payer: Medicare PPO | Source: Ambulatory Visit | Attending: Obstetrics and Gynecology | Admitting: Obstetrics and Gynecology

## 2020-12-12 ENCOUNTER — Other Ambulatory Visit: Payer: Self-pay

## 2020-12-12 DIAGNOSIS — Z13820 Encounter for screening for osteoporosis: Secondary | ICD-10-CM | POA: Insufficient documentation

## 2020-12-12 DIAGNOSIS — Z780 Asymptomatic menopausal state: Secondary | ICD-10-CM | POA: Diagnosis not present

## 2020-12-12 DIAGNOSIS — Z1231 Encounter for screening mammogram for malignant neoplasm of breast: Secondary | ICD-10-CM | POA: Insufficient documentation

## 2021-07-16 ENCOUNTER — Other Ambulatory Visit: Payer: Self-pay | Admitting: Obstetrics and Gynecology

## 2021-07-16 DIAGNOSIS — Z1231 Encounter for screening mammogram for malignant neoplasm of breast: Secondary | ICD-10-CM

## 2021-12-13 ENCOUNTER — Ambulatory Visit
Admission: RE | Admit: 2021-12-13 | Discharge: 2021-12-13 | Disposition: A | Discharge status: Home / Self Care | Payer: Medicare PPO | Source: Ambulatory Visit | Attending: Obstetrics and Gynecology | Admitting: Obstetrics and Gynecology

## 2021-12-13 DIAGNOSIS — Z1231 Encounter for screening mammogram for malignant neoplasm of breast: Secondary | ICD-10-CM | POA: Diagnosis not present

## 2022-07-04 ENCOUNTER — Other Ambulatory Visit: Payer: Self-pay

## 2022-07-04 DIAGNOSIS — Z1231 Encounter for screening mammogram for malignant neoplasm of breast: Secondary | ICD-10-CM

## 2022-12-16 ENCOUNTER — Other Ambulatory Visit: Payer: Self-pay | Admitting: Student

## 2022-12-16 ENCOUNTER — Ambulatory Visit
Admission: RE | Admit: 2022-12-16 | Discharge: 2022-12-16 | Disposition: A | Discharge status: Home / Self Care | Payer: Medicare PPO | Source: Ambulatory Visit | Attending: Obstetrics and Gynecology | Admitting: Obstetrics and Gynecology

## 2022-12-16 DIAGNOSIS — Z1231 Encounter for screening mammogram for malignant neoplasm of breast: Secondary | ICD-10-CM | POA: Insufficient documentation

## 2023-07-11 ENCOUNTER — Other Ambulatory Visit: Payer: Self-pay | Admitting: Student

## 2023-07-11 DIAGNOSIS — Z1231 Encounter for screening mammogram for malignant neoplasm of breast: Secondary | ICD-10-CM

## 2023-11-03 IMAGING — MG MM DIGITAL SCREENING BILAT W/ TOMO AND CAD
8 series · 8 of 24 positions shown · non-contrast
Comparison: Previous exam(s).

CLINICAL DATA: Screening.

EXAM:
DIGITAL SCREENING BILATERAL MAMMOGRAM WITH TOMOSYNTHESIS AND CAD
TECHNIQUE: Bilateral screening digital craniocaudal and mediolateral oblique
mammograms were obtained. Bilateral screening digital breast
tomosynthesis was performed. The images were evaluated with
computer-aided detection.

[L MLO synth-2D]
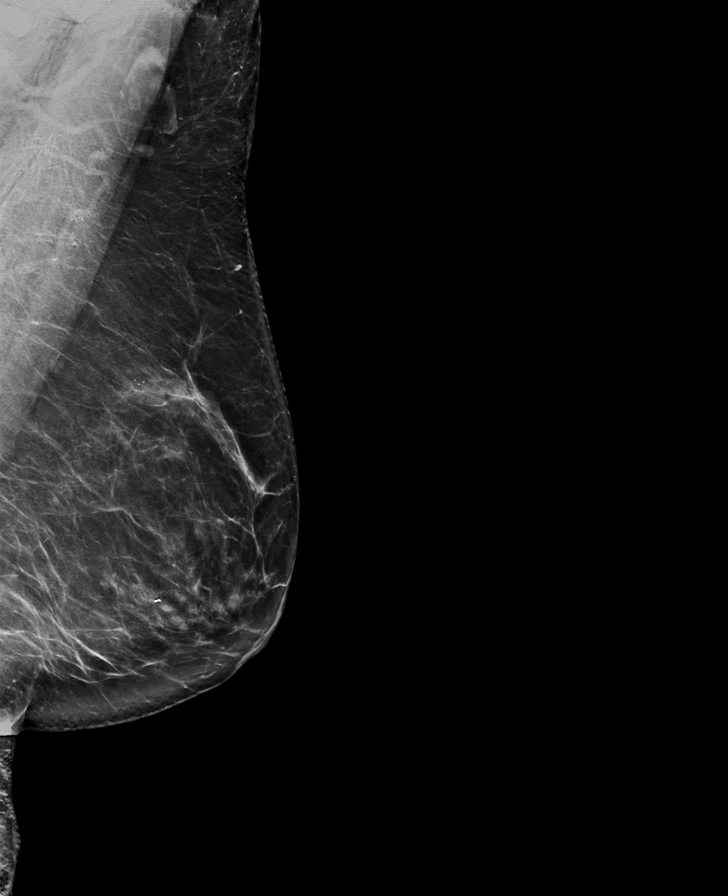

[R MLO synth-2D]
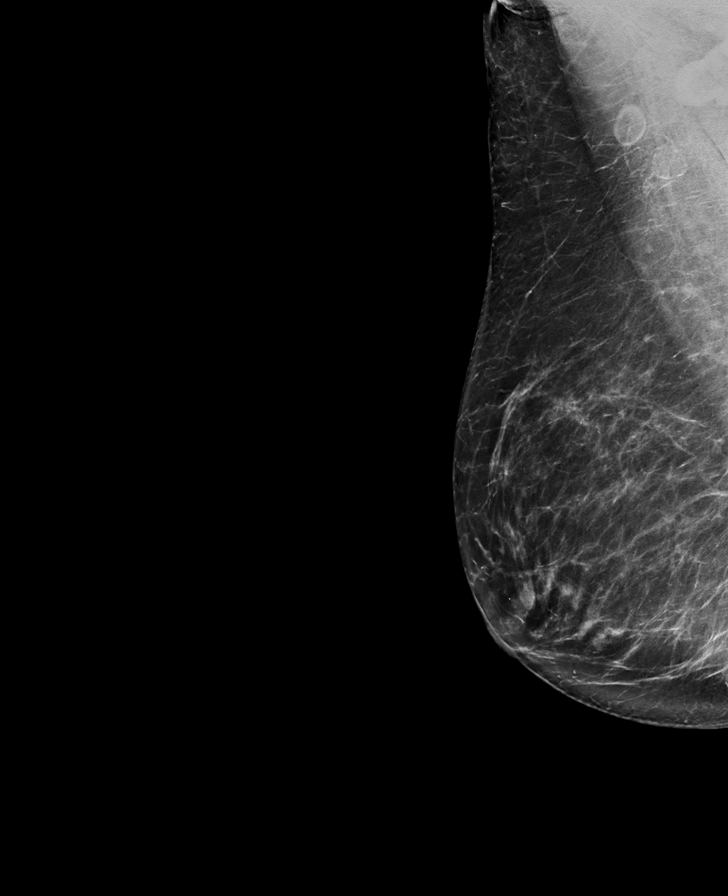

[R CC synth-2D]
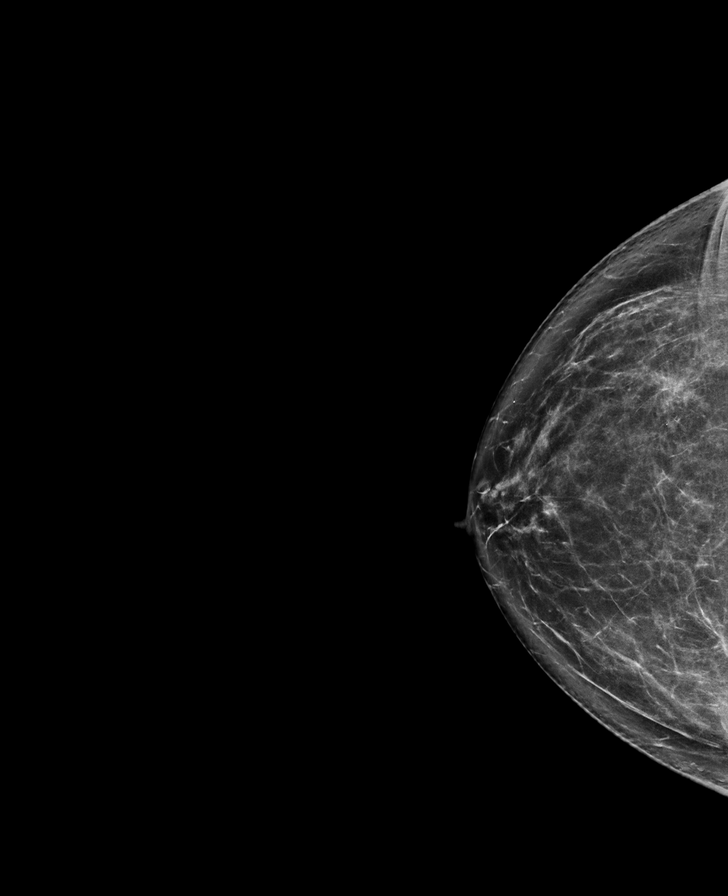

[L CC synth-2D]
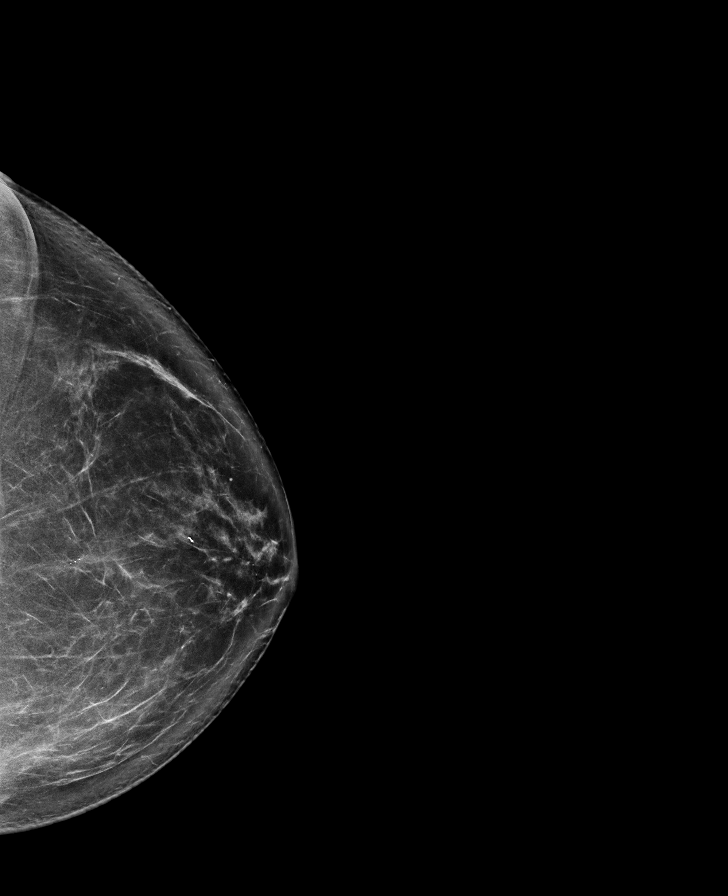

[R CC tomo · tomo slice 41/81.0]
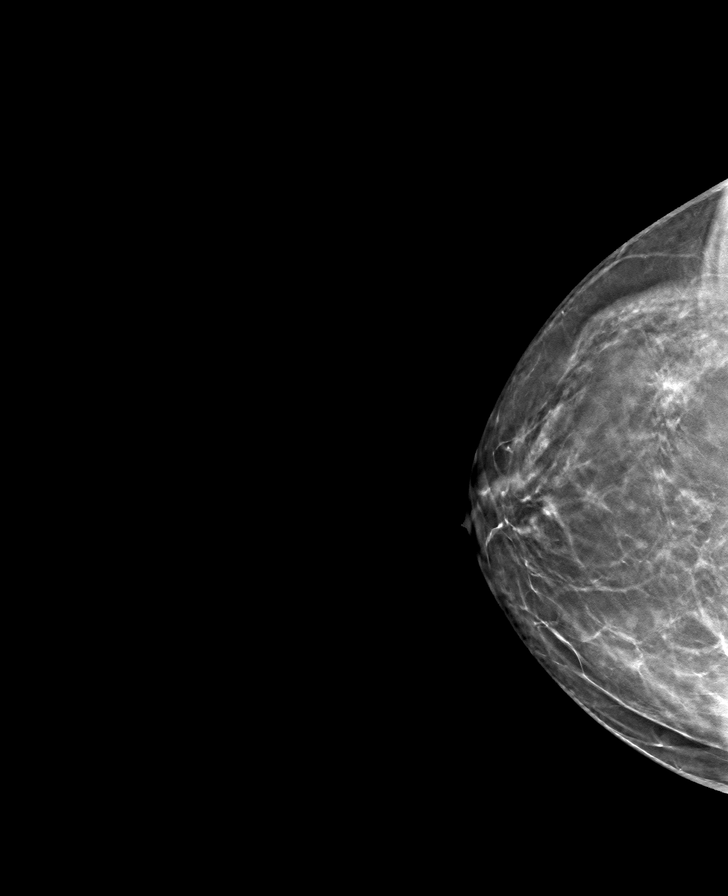

[L MLO tomo · tomo slice 43/86.0]
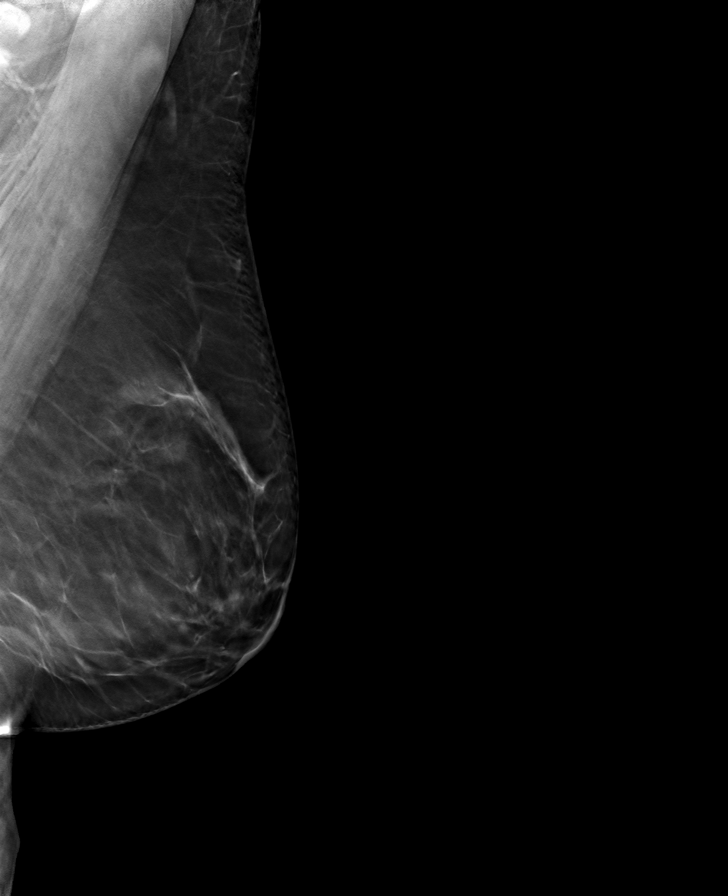

[R MLO tomo · tomo slice 43/84.0]
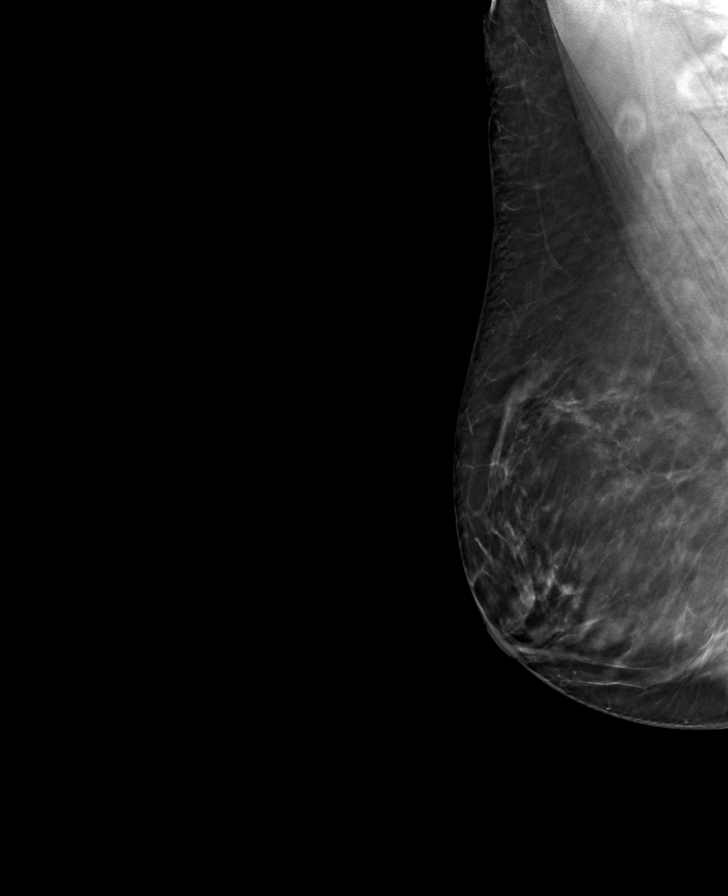

[L CC tomo · tomo slice 43/85.0]
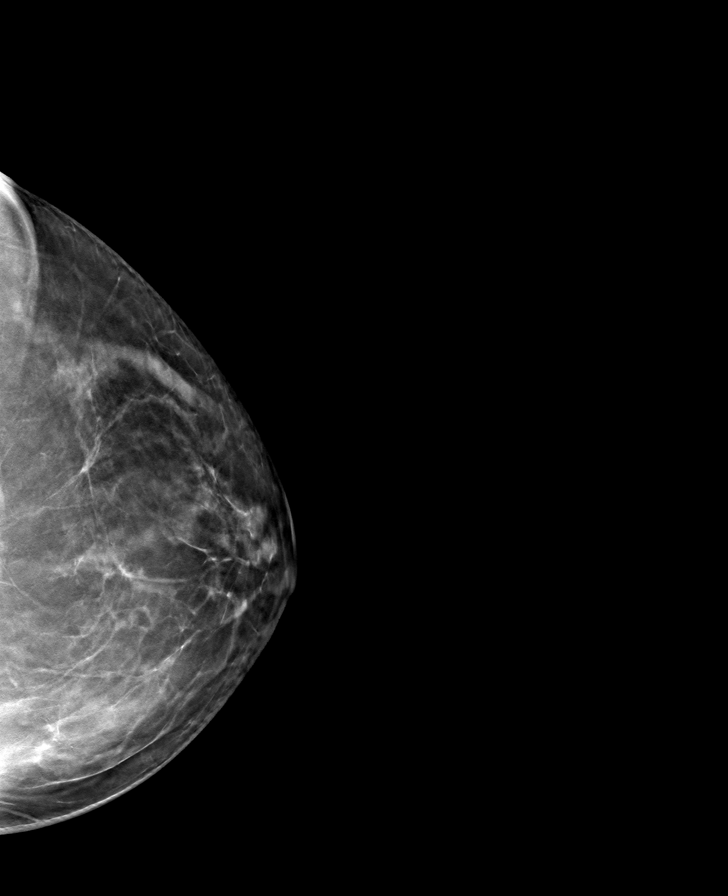

[8 of 24 positions shown; findings below may reference images not displayed]

ACR Breast Density Category b: There are scattered areas of
fibroglandular density.
FINDINGS: There are no findings suspicious for malignancy.
IMPRESSION: No mammographic evidence of malignancy. A result letter of this
screening mammogram will be mailed directly to the patient.

RECOMMENDATION:
Screening mammogram in one year. (Code:51-O-LD2)

BI-RADS CATEGORY  1: Negative.

## 2023-12-17 ENCOUNTER — Ambulatory Visit
Admission: RE | Admit: 2023-12-17 | Discharge: 2023-12-17 | Disposition: A | Source: Ambulatory Visit | Attending: Student | Admitting: Student

## 2023-12-17 DIAGNOSIS — Z1231 Encounter for screening mammogram for malignant neoplasm of breast: Secondary | ICD-10-CM | POA: Insufficient documentation
